# Patient Record
Sex: Male | Born: 1995 | Race: White | Hispanic: No | Marital: Single | Smoking: Never smoker
Health system: Southern US, Community
[De-identification: ages and names within clinical notes are randomized; demographics above are authoritative.]

## PROBLEM LIST (undated history)

## (undated) HISTORY — PX: TONSILLECTOMY: SUR1361

---
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
## 2017-01-28 IMAGING — DX DG CHEST 2V
2 series · 2 of 2 positions shown · non-contrast
Comparison: None.

CLINICAL DATA: Acute onset of tachycardia and low-grade fever.
Initial encounter.

EXAM:
CHEST  2 VIEW

[chest pa]
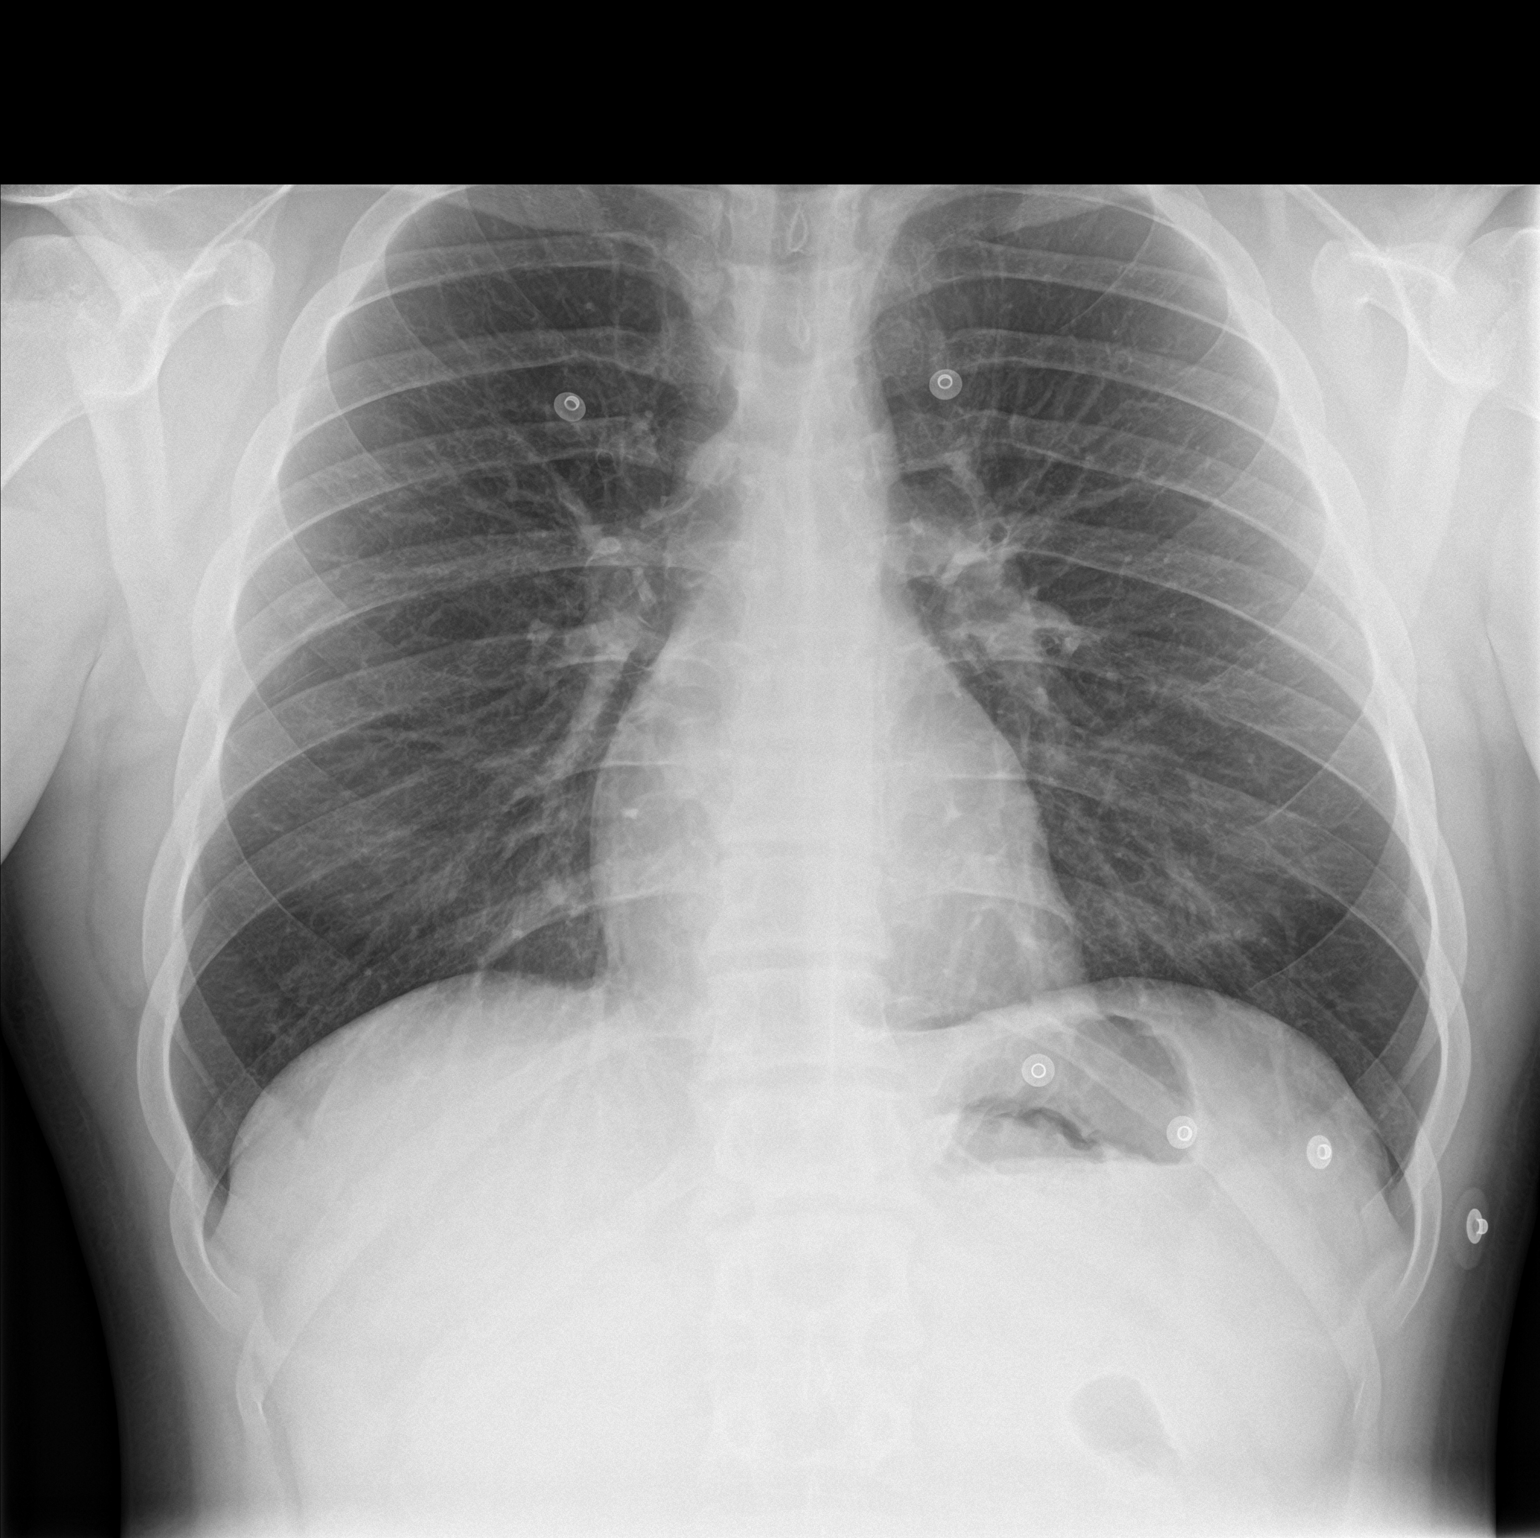

[chest lat]
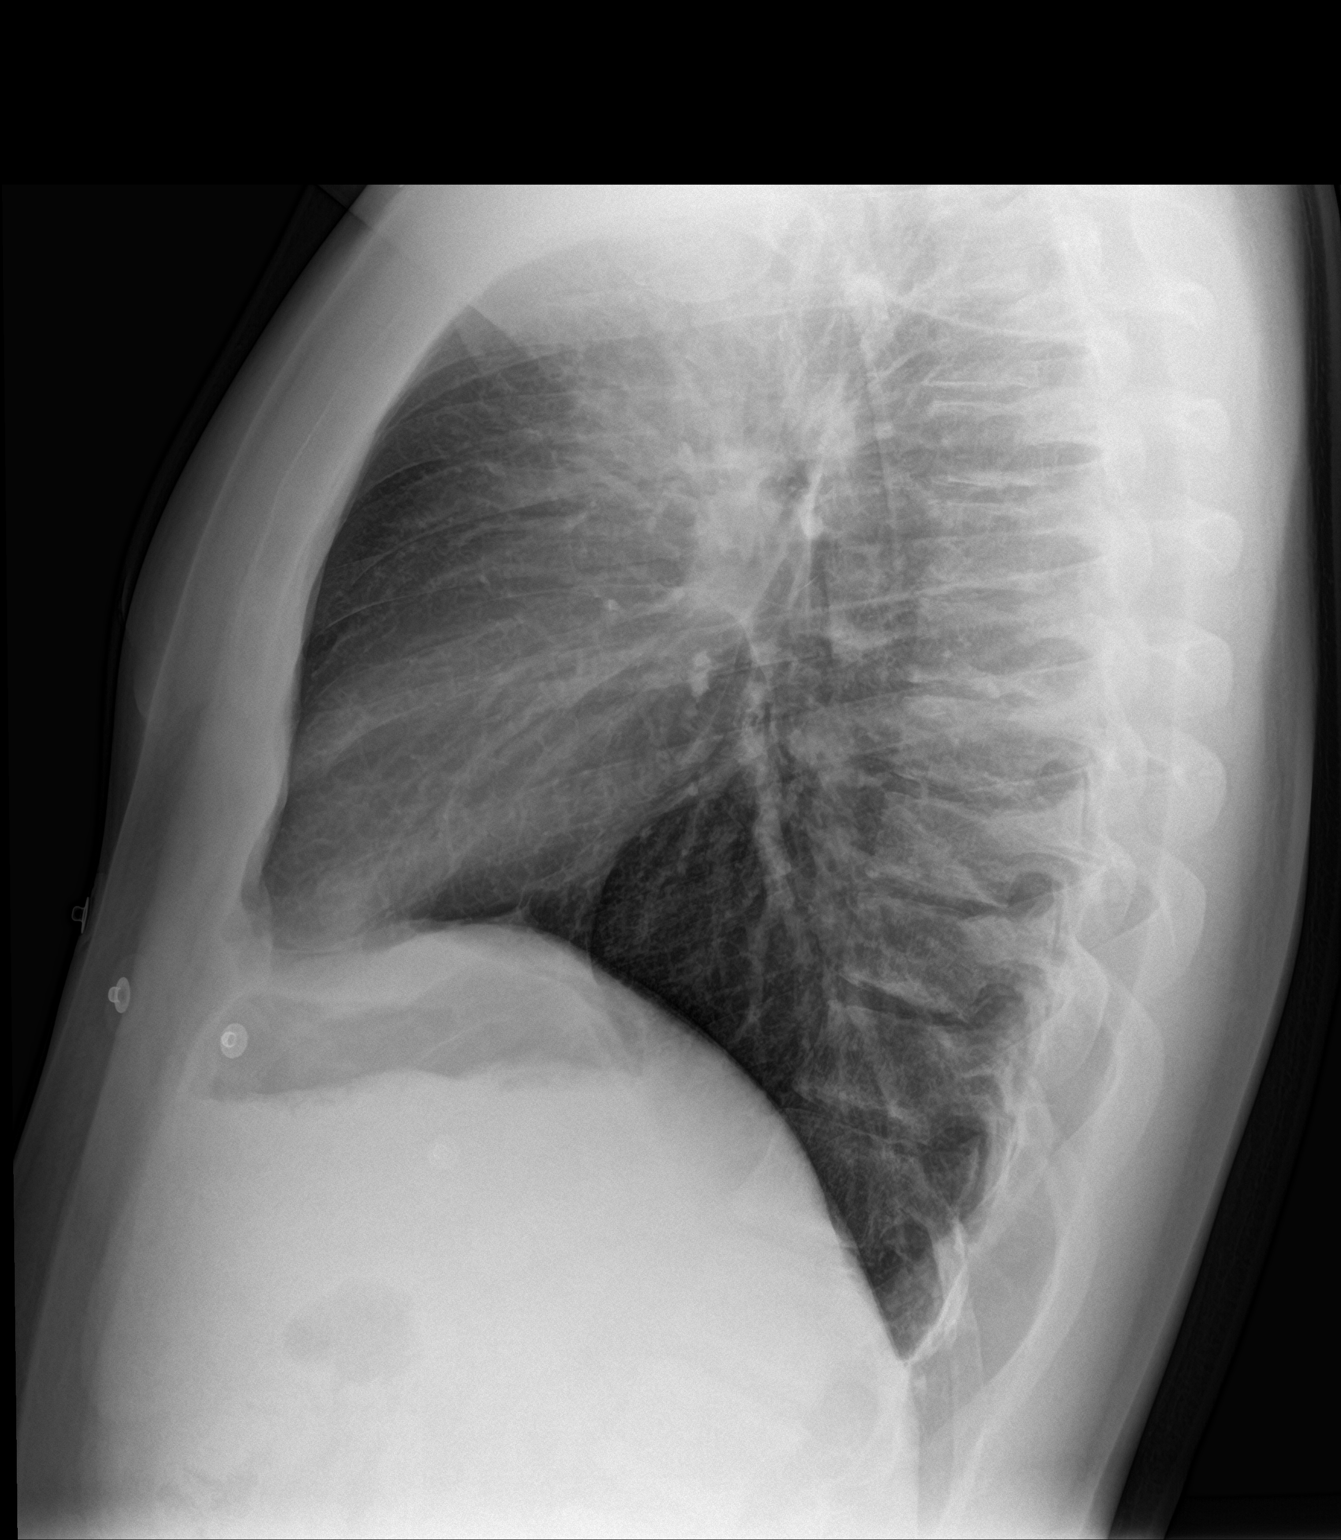

[2 of 2 positions shown; findings below may reference images not displayed]

FINDINGS: The lungs are well-aerated and clear. There is no evidence of focal
opacification, pleural effusion or pneumothorax.

The heart is normal in size; the mediastinal contour is within
normal limits. No acute osseous abnormalities are seen.
IMPRESSION: No acute cardiopulmonary process seen.
